# Patient Record
Sex: Male | Born: 2005 | Race: White | Hispanic: Yes | Marital: Single
Health system: Southern US, Community
[De-identification: ages and names within clinical notes are randomized; demographics above are authoritative.]

## PROBLEM LIST (undated history)

## (undated) DIAGNOSIS — J45909 Unspecified asthma, uncomplicated: Secondary | ICD-10-CM

---
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
## 2023-04-10 ENCOUNTER — Other Ambulatory Visit: Payer: Self-pay

## 2023-04-10 ENCOUNTER — Emergency Department (HOSPITAL_COMMUNITY)
Admission: EM | Admit: 2023-04-10 | Discharge: 2023-04-10 | Disposition: A | Discharge status: Home / Self Care | Payer: Medicaid Other | Attending: Emergency Medicine | Admitting: Emergency Medicine

## 2023-04-10 ENCOUNTER — Encounter (HOSPITAL_COMMUNITY): Payer: Self-pay

## 2023-04-10 DIAGNOSIS — L03114 Cellulitis of left upper limb: Secondary | ICD-10-CM | POA: Insufficient documentation

## 2023-04-10 DIAGNOSIS — M79632 Pain in left forearm: Secondary | ICD-10-CM | POA: Diagnosis present

## 2023-04-10 DIAGNOSIS — D72829 Elevated white blood cell count, unspecified: Secondary | ICD-10-CM | POA: Diagnosis not present

## 2023-04-10 LAB — CBC WITH DIFFERENTIAL/PLATELET
Abs Immature Granulocytes: 0.1 10*3/uL — ABNORMAL HIGH (ref 0.00–0.07)
Basophils Absolute: 0 10*3/uL (ref 0.0–0.1)
Basophils Relative: 0 %
Eosinophils Absolute: 0 10*3/uL (ref 0.0–0.5)
Eosinophils Relative: 0 %
HCT: 44.4 % (ref 39.0–52.0)
Hemoglobin: 15.3 g/dL (ref 13.0–17.0)
Immature Granulocytes: 1 %
Lymphocytes Relative: 6 %
Lymphs Abs: 1 10*3/uL (ref 0.7–4.0)
MCH: 31.6 pg (ref 26.0–34.0)
MCHC: 34.5 g/dL (ref 30.0–36.0)
MCV: 91.7 fL (ref 80.0–100.0)
Monocytes Absolute: 1.5 10*3/uL — ABNORMAL HIGH (ref 0.1–1.0)
Monocytes Relative: 8 %
Neutro Abs: 15.9 10*3/uL — ABNORMAL HIGH (ref 1.7–7.7)
Neutrophils Relative %: 85 %
Platelets: 318 10*3/uL (ref 150–400)
RBC: 4.84 MIL/uL (ref 4.22–5.81)
RDW: 12.8 % (ref 11.5–15.5)
WBC: 18.6 10*3/uL — ABNORMAL HIGH (ref 4.0–10.5)
nRBC: 0 % (ref 0.0–0.2)

## 2023-04-10 LAB — BASIC METABOLIC PANEL
Anion gap: 8 (ref 5–15)
BUN: 8 mg/dL (ref 6–20)
CO2: 23 mmol/L (ref 22–32)
Calcium: 8.9 mg/dL (ref 8.9–10.3)
Chloride: 104 mmol/L (ref 98–111)
Creatinine, Ser: 0.69 mg/dL (ref 0.61–1.24)
GFR, Estimated: >60 mL/min (ref >60)
Glucose, Bld: 120 mg/dL — ABNORMAL HIGH (ref 70–99)
Potassium: 3.6 mmol/L (ref 3.5–5.1)
Sodium: 135 mmol/L (ref 135–145)

## 2023-04-10 MED ORDER — KETOROLAC TROMETHAMINE 30 MG/ML IJ SOLN
30.0000 mg | Freq: Once | INTRAMUSCULAR | Status: AC
Start: 2023-04-10 — End: 2023-04-10
  Administered 2023-04-10: 30 mg via INTRAVENOUS
  Filled 2023-04-10: qty 1

## 2023-04-10 MED ORDER — CEPHALEXIN 500 MG PO CAPS
500.0000 mg | ORAL_CAPSULE | Freq: Four times a day (QID) | ORAL | 0 refills | Status: AC
Start: 2023-04-10 — End: ?

## 2023-04-10 MED ORDER — SULFAMETHOXAZOLE-TRIMETHOPRIM 800-160 MG PO TABS
1.0000 | ORAL_TABLET | Freq: Once | ORAL | Status: AC
  Administered 2023-04-10: 1 via ORAL
  Filled 2023-04-10: qty 1

## 2023-04-10 MED ORDER — SODIUM CHLORIDE 0.9 % IV SOLN
1.0000 g | Freq: Once | INTRAVENOUS | Status: AC
  Administered 2023-04-10: 1 g via INTRAVENOUS
  Filled 2023-04-10: qty 10

## 2023-04-10 MED ORDER — ACETAMINOPHEN 500 MG PO TABS: 1000.0000 mg | ORAL_TABLET | Freq: Once | ORAL | Status: DC

## 2023-04-10 MED ORDER — SULFAMETHOXAZOLE-TRIMETHOPRIM 800-160 MG PO TABS: 1.0000 | ORAL_TABLET | Freq: Two times a day (BID) | ORAL | 0 refills | Status: AC

## 2023-04-10 MED ORDER — OXYCODONE HCL 5 MG PO TABS
5.0000 mg | ORAL_TABLET | Freq: Once | ORAL | Status: AC
  Administered 2023-04-10: 5 mg via ORAL
  Filled 2023-04-10: qty 1

## 2023-04-10 MED ORDER — IBUPROFEN 800 MG PO TABS
800.0000 mg | ORAL_TABLET | Freq: Once | ORAL | Status: AC
Start: 2023-04-10 — End: 2023-04-10
  Administered 2023-04-10: 800 mg via ORAL
  Filled 2023-04-10: qty 1

## 2023-04-10 NOTE — ED Triage Notes (Signed)
Pt came in for swelling on a new tattoo he received two days ago. Pt's states the tattoo started to have "pus and is red". Pt states his whole arm is hurting and is burning. Pt also states he feels lightheaded. Pt has taken 1000mg  of tylenol at 1700 today.

## 2023-04-10 NOTE — Discharge Instructions (Addendum)
Take Keflex 4 times per day for the next 1 week.  Take Bactrim 2 times per day for the next 1 week.  You should begin to notice improvement in your symptoms over the next 1 to 2 days.  Return to the emergency department if you develop persistent fever, notice increased pain in the arm, or have trouble moving your arm.

## 2023-04-10 NOTE — ED Provider Notes (Signed)
[place] EMERGENCY DEPARTMENT AT [facility] Provider Note   CSN: [id number] Arrival date & time: 04/10/23  1844     History  Chief Complaint  Patient presents with   Tattoo Infection    Ryan Stevenson is a 17 y.o. male.  This is a otherwise healthy 17 year old male here today with pain and redness on the volar left forearm after tattoo that he had done 2 days ago.  He has felt chills.        Home Medications Prior to Admission medications   Medication Sig Start Date End Date Taking? Authorizing Provider  cephALEXin (KEFLEX) 500 MG capsule Take 1 capsule (500 mg total) by mouth 4 (four) times daily. 04/10/23  Yes Anders Simmonds T, DO  sulfamethoxazole-trimethoprim (BACTRIM DS) 800-160 MG tablet Take 1 tablet by mouth 2 (two) times daily for 7 days. 04/10/23 04/17/23 Yes Anders Simmonds T, DO  acetaminophen (TYLENOL) 325 MG tablet Take 2 tablets (650 mg total) by mouth every 4 (four) hours as needed (for pain). 04/09/21   Armida Sans, PA-C  aspirin EC 81 MG tablet Take 1 tablet (81 mg total) by mouth 2 (two) times daily. For DVT prophylaxis for 30 days after surgery. Take with breakfast and dinner. 04/09/21   Armida Sans, PA-C  cetirizine (ZYRTEC) 10 MG tablet Take 10 mg by mouth at bedtime as needed for allergies. 12/13/20   [provider]  methocarbamol (ROBAXIN) 500 MG tablet Take 1 tablet (500 mg total) by mouth every 8 (eight) hours as needed for muscle spasms. 04/09/21   Janine Ores K, PA-C  ondansetron (ZOFRAN) 4 MG tablet Take 1 tablet (4 mg total) by mouth every 8 (eight) hours as needed for nausea or vomiting. 04/09/21   Janine Ores K, PA-C  oxyCODONE (ROXICODONE) 5 MG immediate release tablet Take 1-2 tablets (5-10 mg total) by mouth every 4 (four) hours as needed for severe pain. 04/09/21   Armida Sans, PA-C  PROAIR HFA 108 [phone] Base) MCG/ACT inhaler Inhale 2 puffs into the lungs every 4 (four) hours as needed for wheezing  or shortness of breath. 12/13/20   [provider]  sennosides-docusate sodium (SENOKOT-S) 8.6-50 MG tablet Take 2 tablets by mouth daily. 04/09/21   Armida Sans, PA-C      Allergies    Patient has no known allergies.    Review of Systems   Review of Systems  Physical Exam Updated Vital Signs BP 139/89 (BP Location: Right Arm)   Pulse 81   Temp 100.1 F (37.8 C)   Resp 18   Ht 5\' 9"  (1.753 m)   Wt 86.2 kg   SpO2 100%   BMI 28.06 kg/m  Physical Exam Vitals reviewed.  Constitutional:      Appearance: He is not toxic-appearing.  Skin:    Comments: Redness surrounding large forearm tattoo on the left forearm.  There is no crepitus, no streaking, no pus or purulence.  Neurological:     Mental Status: He is alert.     ED Results / Procedures / Treatments   Labs (all labs ordered are listed, but only abnormal results are displayed) Labs Reviewed  CBC WITH DIFFERENTIAL/PLATELET - Abnormal; Notable for the following components:      Result Value   WBC 18.6 (*)    Neutro Abs 15.9 (*)    Monocytes Absolute 1.5 (*)    Abs Immature Granulocytes 0.10 (*)    All other components within normal limits  BASIC  METABOLIC PANEL - Abnormal; Notable for the following components:   Glucose, Bld 120 (*)    All other components within normal limits    EKG None  Radiology No results found.  Procedures Procedures    Medications Ordered in ED Medications  cefTRIAXone (ROCEPHIN) 1 g in sodium chloride 0.9 % 100 mL IVPB (1 g Intravenous New Bag/Given 04/10/23 2039)  ibuprofen (ADVIL) tablet 800 mg (800 mg Oral Given 04/10/23 1947)  sulfamethoxazole-trimethoprim (BACTRIM DS) 800-160 MG per tablet 1 tablet (1 tablet Oral Given 04/10/23 2038)  ketorolac (TORADOL) 30 MG/ML injection 30 mg (30 mg Intravenous Given 04/10/23 2038)  oxyCODONE (Oxy IR/ROXICODONE) immediate release tablet 5 mg (5 mg Oral Given 04/10/23 2038)    ED Course/ Medical Decision Making/ A&P                                  Medical Decision Making 17 year old male here today with infection from tattoo.  Plan-does not appear to be necrotizing soft tissue infection.  Patient does have an elevated temp, however nontachycardic, nontachypneic.  I do not believe patient has sepsis at this time.  Patient does have an elevated white count, however this is consistent with cellulitis in a young healthy patient.  Will treat with a dose of IV antibiotics here in the emergency department.  Analgesia provided.  Believe patient will be appropriate for outpatient treatment.  Risk Prescription drug management.           Final Clinical Impression(s) / ED Diagnoses Final diagnoses:  Cellulitis of left arm    Rx / DC Orders ED Discharge Orders          Ordered    sulfamethoxazole-trimethoprim (BACTRIM DS) 800-160 MG tablet  2 times daily        04/10/23 2108    cephALEXin (KEFLEX) 500 MG capsule  4 times daily        04/10/23 2108              Anders Simmonds T, DO 04/10/23 2109

## 2023-04-10 NOTE — ED Provider Triage Note (Signed)
Emergency Medicine Provider Triage Evaluation Note  Ryan Stevenson , Stevenson 17 y.o. male  was evaluated in triage.  Pt complains of fever, arm infection. New tattoo to left forearm 2 days ago. Progressive redness, swelling to forearm. Now feels like he has pain to his left biceps. Chills and sub fever at home. No meds PTA  Review of Systems  Positive: Fever, left arm pain Negative:   Physical Exam  BP 139/89 (BP Location: Right Arm)   Pulse 81   Temp 100.1 F (37.8 C)   Resp 18   Ht 5\' 9"  (1.753 m)   Wt 86.2 kg   SpO2 100%   BMI 28.06 kg/m  Gen:   Awake, no distress   Resp:  Normal effort  MSK:   Moves extremities without difficulty  Other:  Tenderness, erythema to left forearm, no induration  Medical Decision Making  Medically screening exam initiated at 7:21 PM.  Appropriate orders placed.  Ryan Stevenson was informed that the remainder of the evaluation will be completed by another provider, this initial triage assessment does not replace that evaluation, and the importance of remaining in the ED until their evaluation is complete.  cellulitis   Ryan Stamos A, PA-C 04/10/23 1923
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
## 2023-10-30 ENCOUNTER — Encounter (HOSPITAL_COMMUNITY): Payer: Self-pay

## 2023-10-30 ENCOUNTER — Emergency Department (HOSPITAL_COMMUNITY)
Admission: EM | Admit: 2023-10-30 | Discharge: 2023-10-30 | Disposition: A | Discharge status: Home / Self Care | Payer: Self-pay | Attending: Emergency Medicine | Admitting: Emergency Medicine

## 2023-10-30 ENCOUNTER — Emergency Department (HOSPITAL_COMMUNITY): Payer: Self-pay

## 2023-10-30 ENCOUNTER — Other Ambulatory Visit: Payer: Self-pay

## 2023-10-30 DIAGNOSIS — W228XXA Striking against or struck by other objects, initial encounter: Secondary | ICD-10-CM | POA: Insufficient documentation

## 2023-10-30 DIAGNOSIS — Z7982 Long term (current) use of aspirin: Secondary | ICD-10-CM | POA: Insufficient documentation

## 2023-10-30 DIAGNOSIS — S0990XA Unspecified injury of head, initial encounter: Secondary | ICD-10-CM | POA: Insufficient documentation

## 2023-10-30 MED ORDER — ACETAMINOPHEN 500 MG PO TABS
1000.0000 mg | ORAL_TABLET | Freq: Once | ORAL | Status: AC
  Administered 2023-10-30: 1000 mg via ORAL
  Filled 2023-10-30: qty 2

## 2023-10-30 NOTE — Discharge Instructions (Signed)
 It was a pleasure taking care of you here today  Your imaging today is reassuring  I would recommend Tylenol , ibuprofen  as needed for pain  Ice to the back of the head follow-up outpatient primary care provider  Tylenol  1000 mg every 6 hours. Do not take more that 4000 mg in a 24 hour period.  Ibuprofen  800 mg every 8 hours, do not take more than 2400 mg in a 24 hour period.
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
